# Patient Record
Sex: Male | Born: 1955 | Race: White | Hispanic: No | Marital: Married | Smoking: Former smoker
Health system: Southern US, Community
[De-identification: ages and names within clinical notes are randomized; demographics above are authoritative.]

## PROBLEM LIST (undated history)

## (undated) DIAGNOSIS — G4730 Sleep apnea, unspecified: Secondary | ICD-10-CM

## (undated) DIAGNOSIS — E119 Type 2 diabetes mellitus without complications: Secondary | ICD-10-CM

## (undated) HISTORY — PX: TONSILLECTOMY: SUR1361

## (undated) HISTORY — PX: CARDIAC CATHETERIZATION: SHX172

## (undated) HISTORY — PX: APPENDECTOMY: SHX54

---

## 2004-08-10 ENCOUNTER — Emergency Department: Payer: Self-pay | Admitting: Emergency Medicine

## 2005-04-12 ENCOUNTER — Ambulatory Visit: Payer: Self-pay | Admitting: Unknown Physician Specialty

## 2006-04-30 IMAGING — CT CT HEAD WITHOUT CONTRAST
1 series · 16 of 30 positions shown, 20 images · non-contrast
Comparison: none

REASON FOR EXAM: headache pt in rm 2
COMMENTS:

[Series 2: without · axial · non-contrast · 0.39mm/px · z∈[+158,+293]mm · 16 of 30 slices shown, 20 images]
[im 2/30  brain]
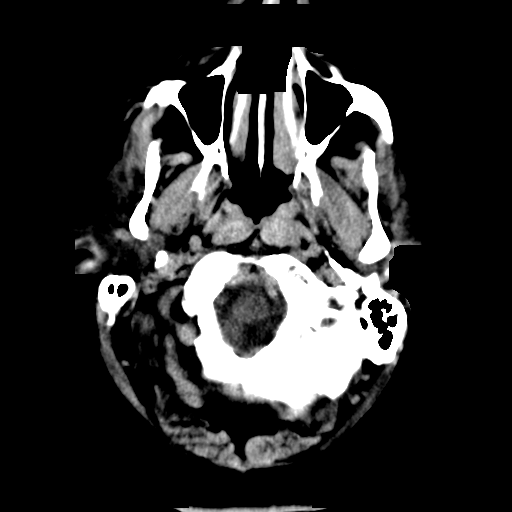
[im 2/30  bone]
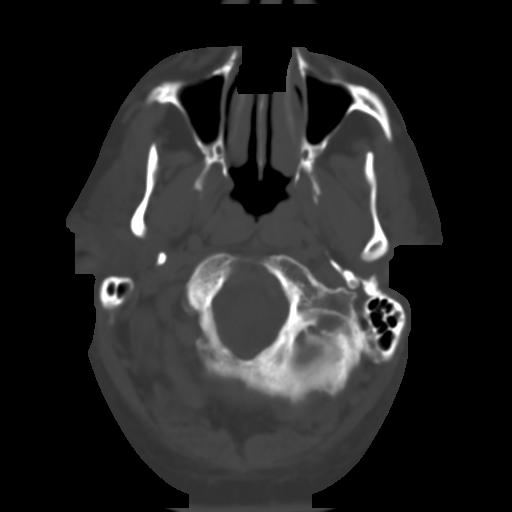
[im 4/30  brain]
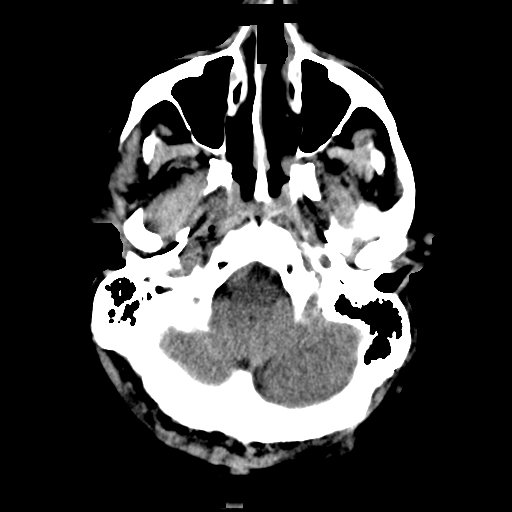
[im 6/30  brain]
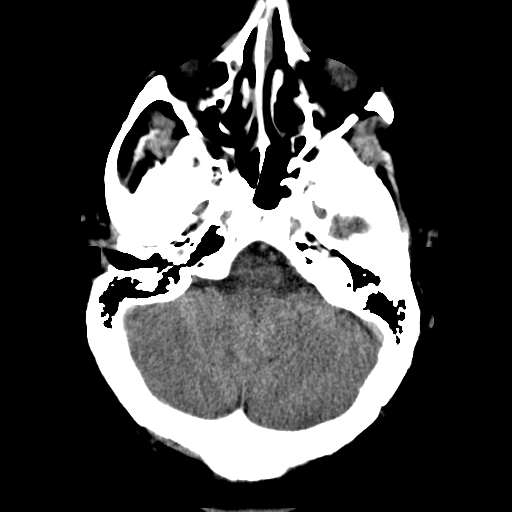
[im 8/30  brain]
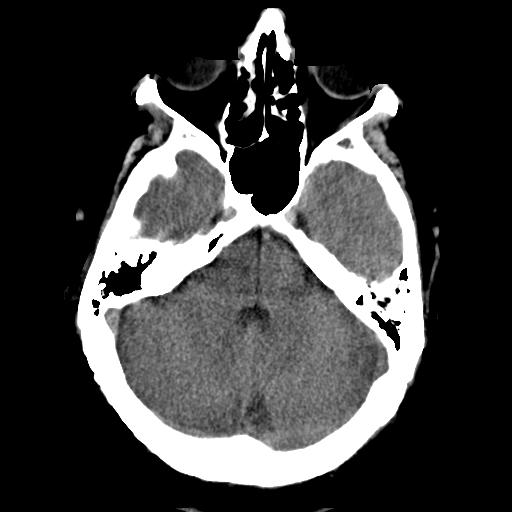
[im 9/30  brain]
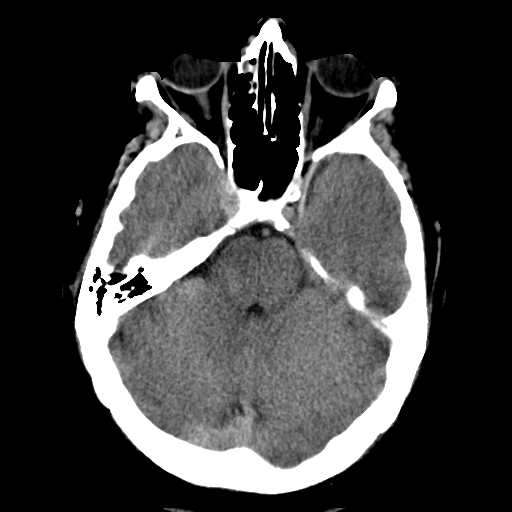
[im 9/30  bone]
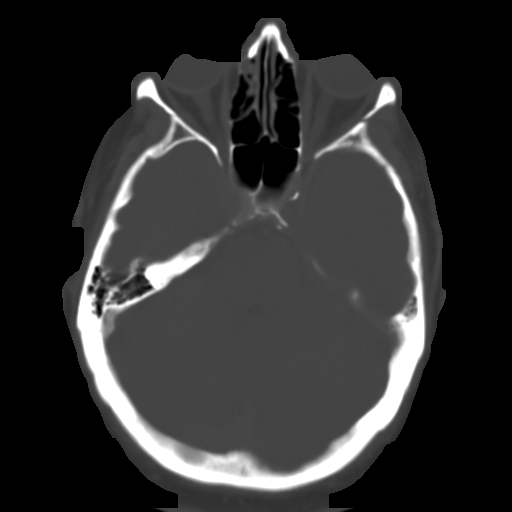
[im 11/30  brain]
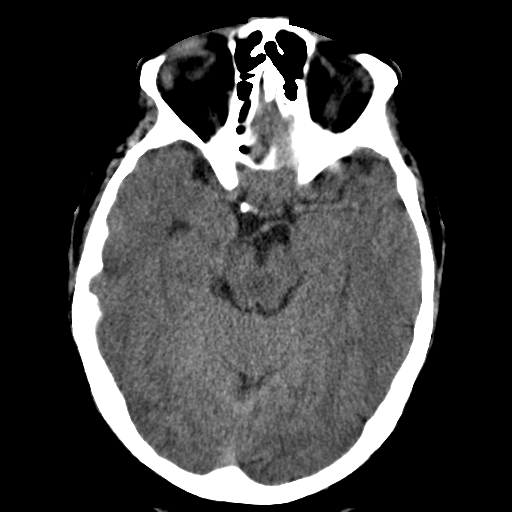
[im 13/30  brain]
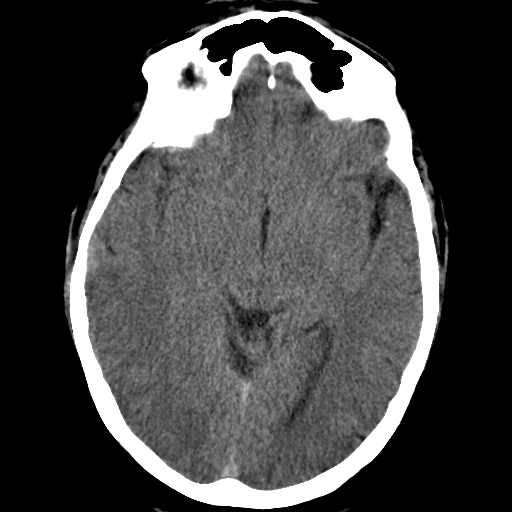
[im 15/30  brain]
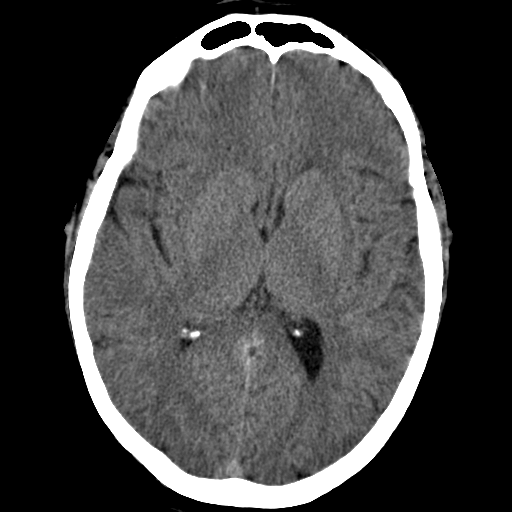
[im 16/30  brain]
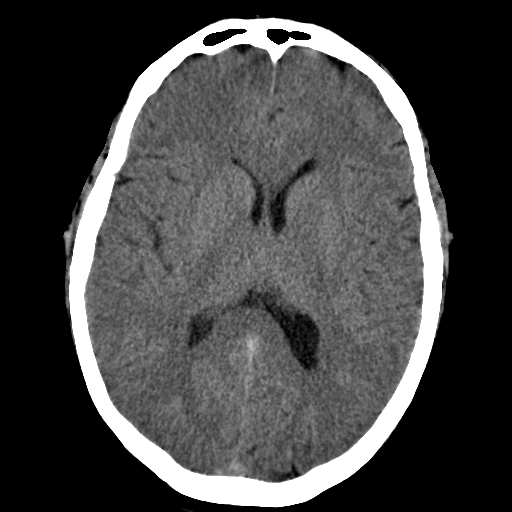
[im 16/30  bone]
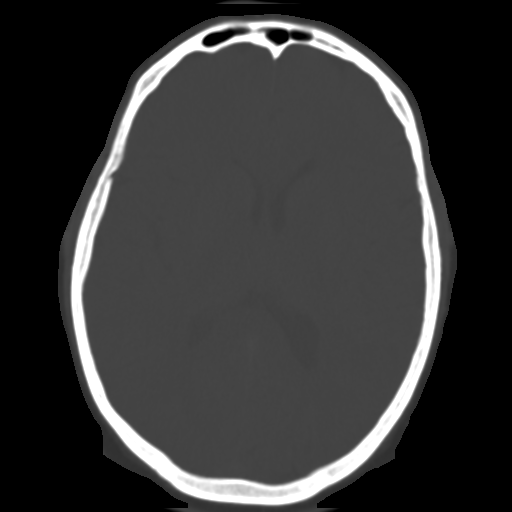
[im 18/30  brain]
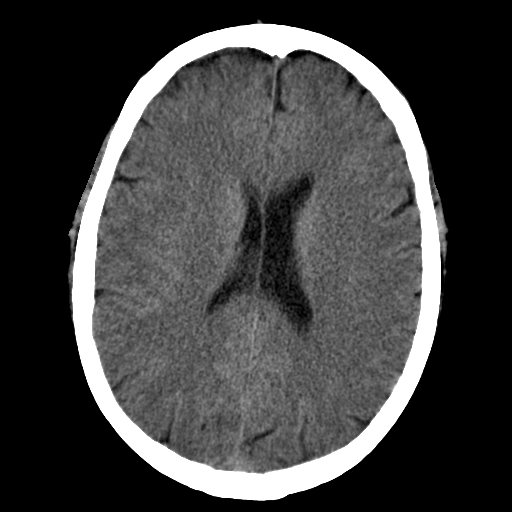
[im 20/30  brain]
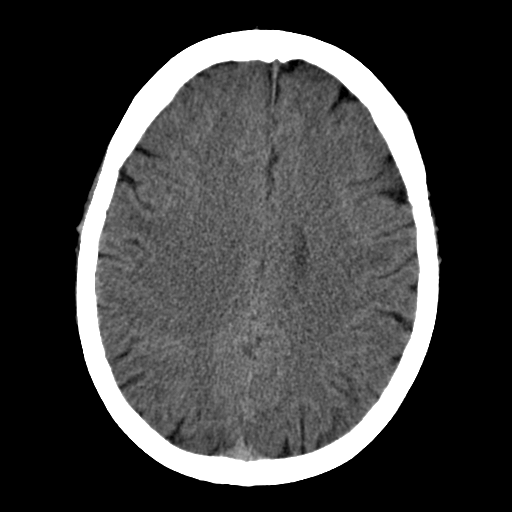
[im 22/30  brain]
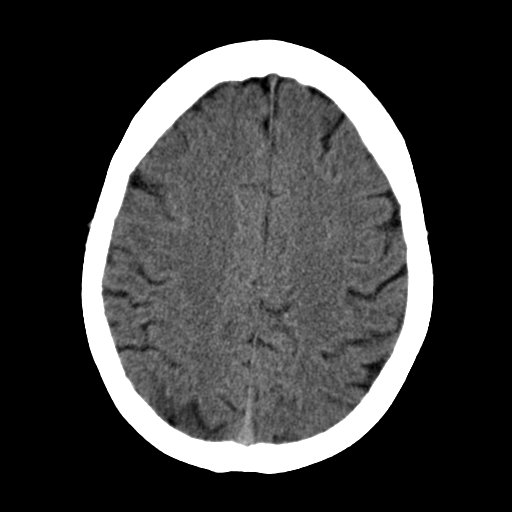
[im 23/30  brain]
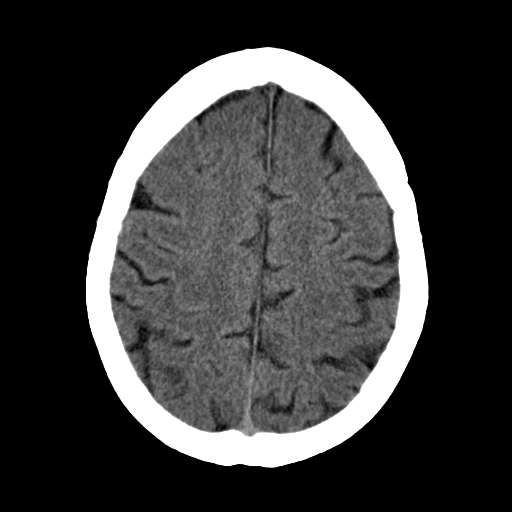
[im 23/30  bone]
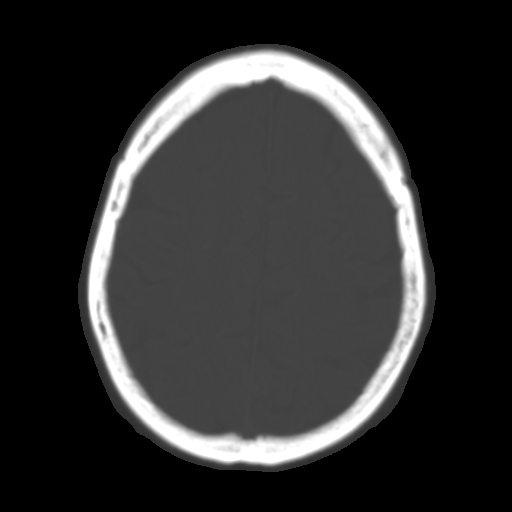
[im 25/30  brain]
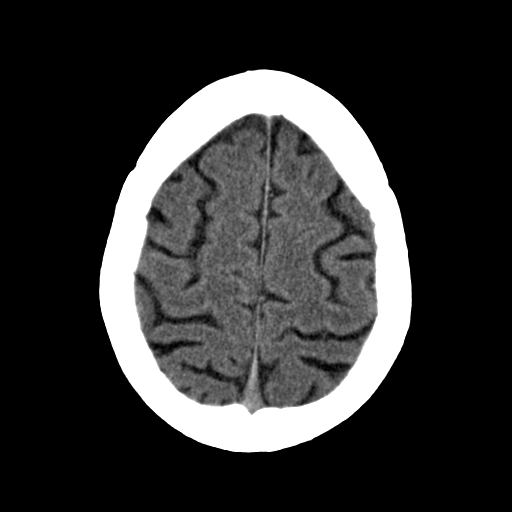
[im 27/30  brain]
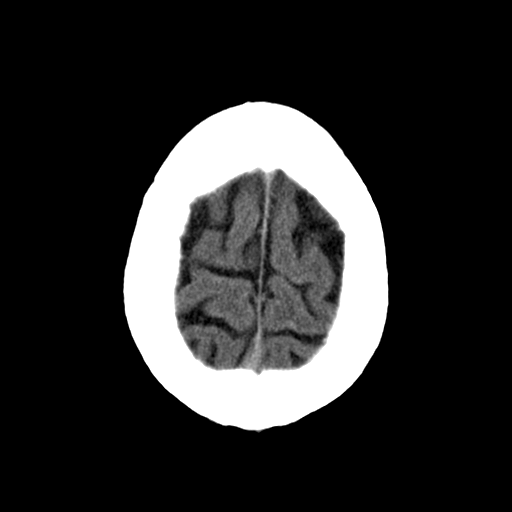
[im 29/30  brain]
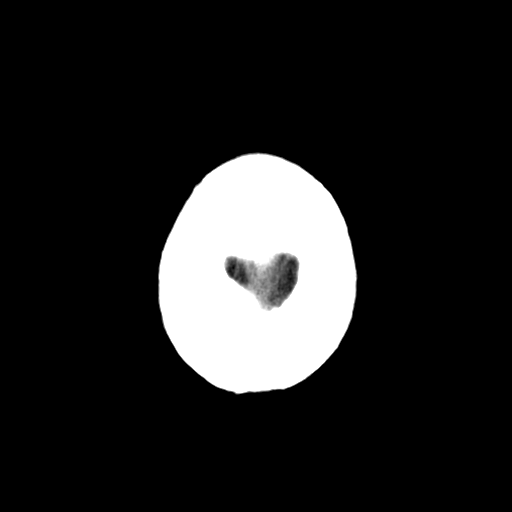

[16 of 30 positions shown; findings below may reference images not displayed]

PROCEDURE:     CT  - CT HEAD WITHOUT CONTRAST  - August 10, 2004  [DATE]

RESULT:     An emergent unenhanced Head CT was performed for headache. No
subarachnoid hemorrhage is identified. No intracerebral bleeds with no mass
effect. No shift to the midline. The ventricles appear within normal limits.
 No subdural hematomas are noted. No infarcts are noted.
IMPRESSION: 1)No acute findings are identified on the unenhanced emergent Head CT.

The report was called to the [HOSPITAL] the conclusion of the
examination.

## 2010-01-28 ENCOUNTER — Observation Stay: Payer: Self-pay | Admitting: Internal Medicine

## 2011-07-19 ENCOUNTER — Ambulatory Visit: Payer: Self-pay | Admitting: Family Medicine

## 2011-10-18 IMAGING — CR DG CHEST 1V PORT
1 series · 1 of 1 positions shown · non-contrast
Comparison: none

REASON FOR EXAM: pain
COMMENTS:

PROCEDURE:     DXR - DXR PORTABLE CHEST SINGLE VIEW  - January 28, 2010  [DATE]
RESULT:     The lung fields are clear. The heart, mediastinal and osseous
structures show no acute changes. Monitoring electrodes are present.

[view not recorded]
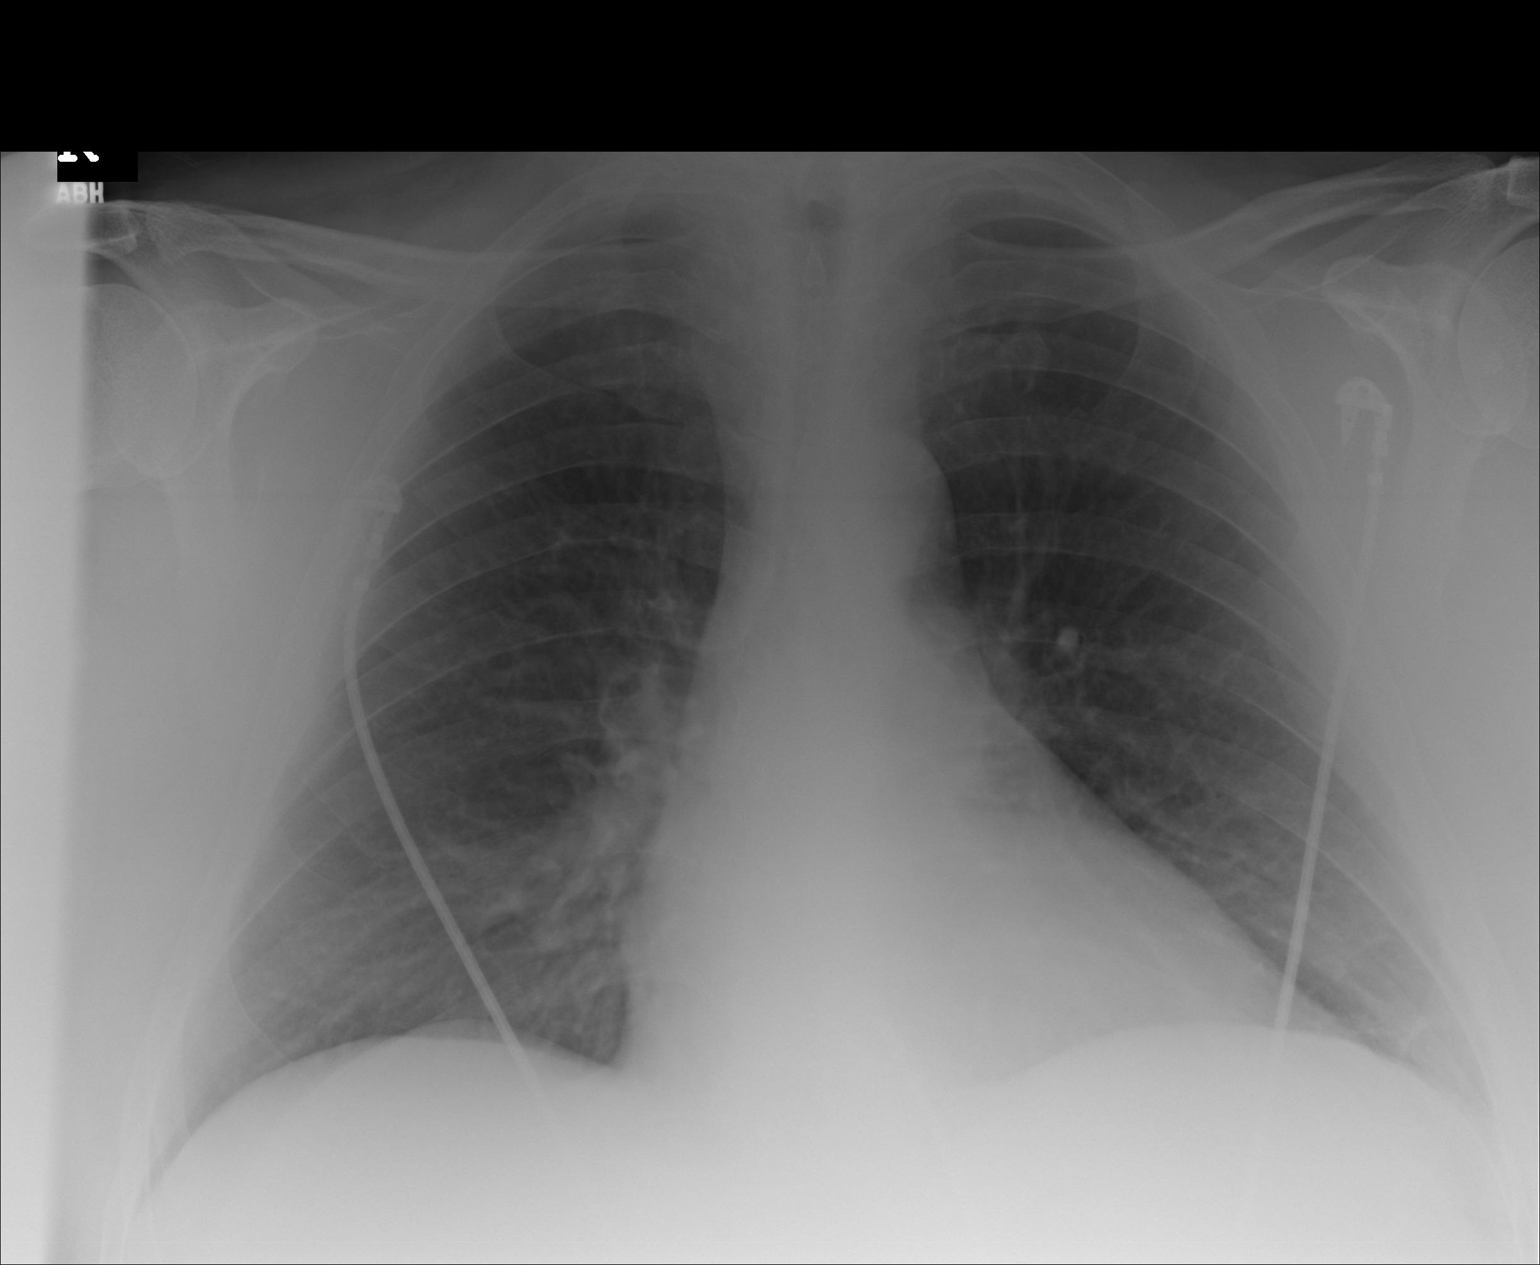

[1 of 1 positions shown; findings below may reference images not displayed]

IMPRESSION: No acute changes are identified.

## 2013-04-07 IMAGING — CT CT STONE STUDY
1 of 2 series · 15 of 32 positions shown, 19 images · non-contrast
Comparison: none

REASON FOR EXAM: hematuria eval for kidney stones
COMMENTS:

PROCEDURE:     KCT - KCT ABDOMEN/PELVIS WO (STONE)  - July 19, 2011  [DATE]
RESULT:
TECHNIQUE: Helical noncontrast 3 mm sections were obtained from the lung
bases through the pubic symphysis.

[Series 2: stonelg 3.0 i40s 3 · axial · 0.98mm/px · z∈[-769,-337]mm · 15 of 157 slices shown, 19 images]
[im 7/157  soft-tissue]
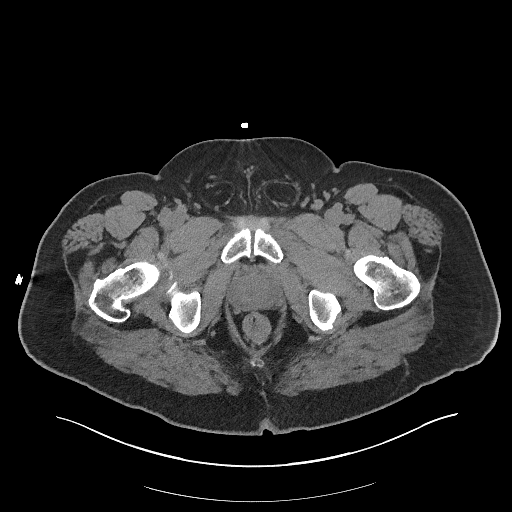
[im 7/157  bone]
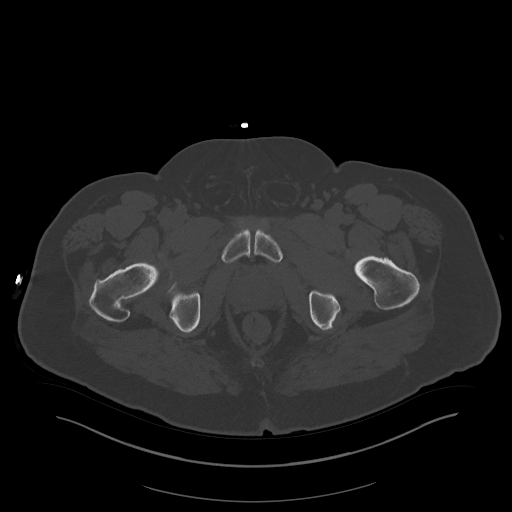
[im 19/157  soft-tissue]
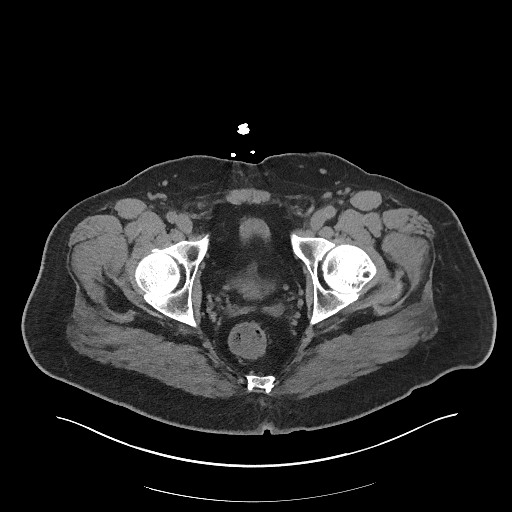
[im 31/157  soft-tissue]
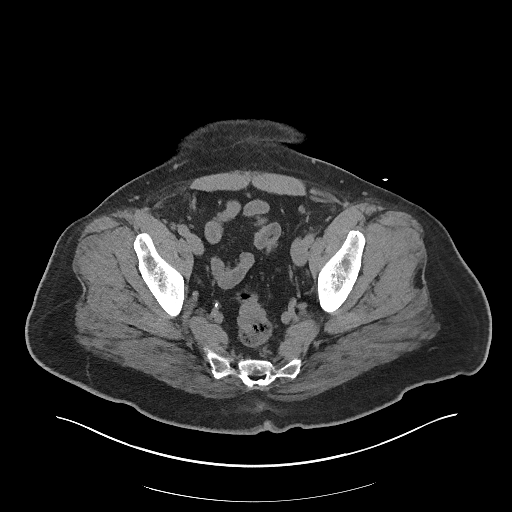
[im 43/157  soft-tissue]
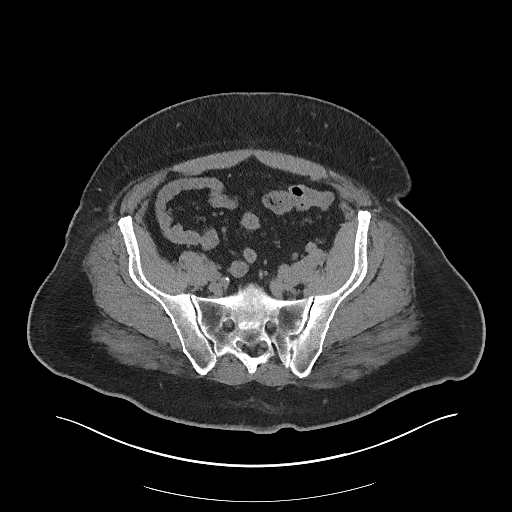
[im 55/157  soft-tissue]
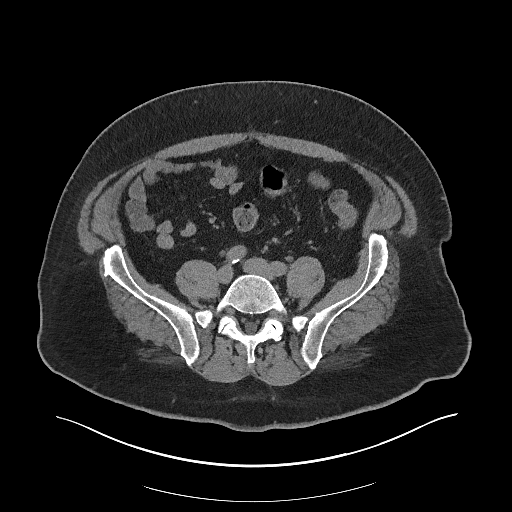
[im 67/157  soft-tissue]
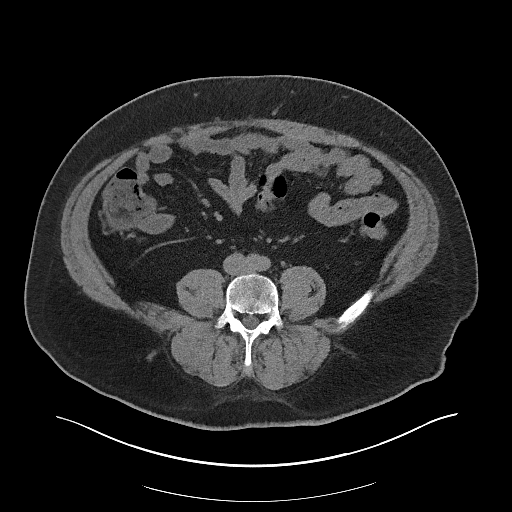
[im 79/157  soft-tissue]
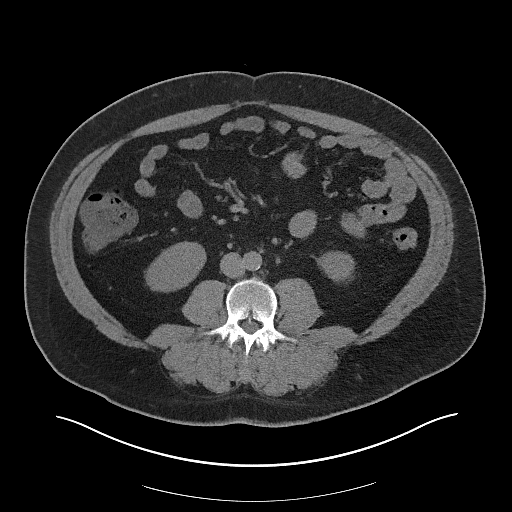
[im 91/157  soft-tissue]
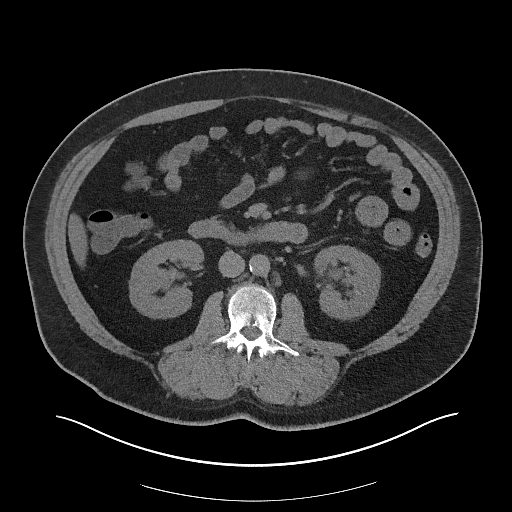
[im 103/157  soft-tissue]
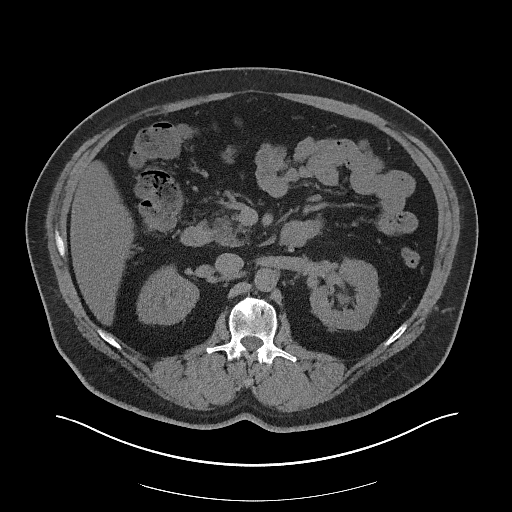
[im 103/157  bone]
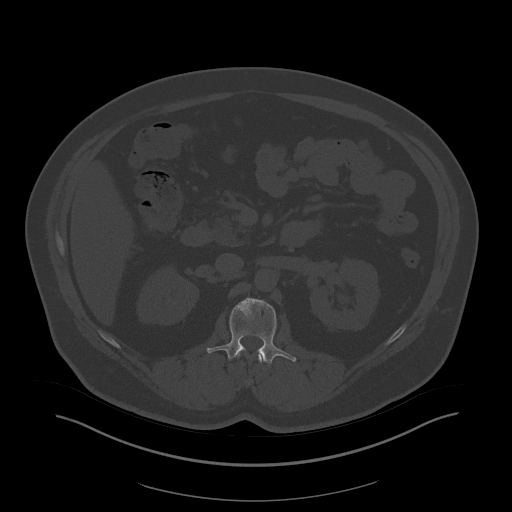
[im 115/157  soft-tissue]
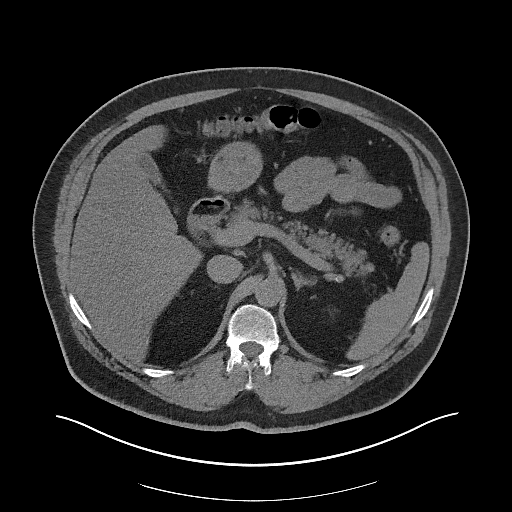
[im 127/157  soft-tissue]
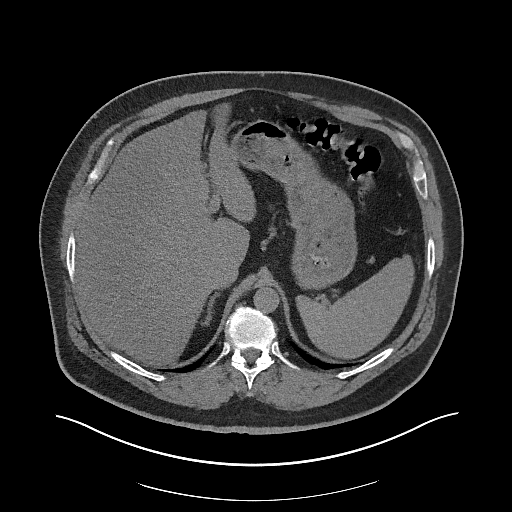
[im 133/157  lung]
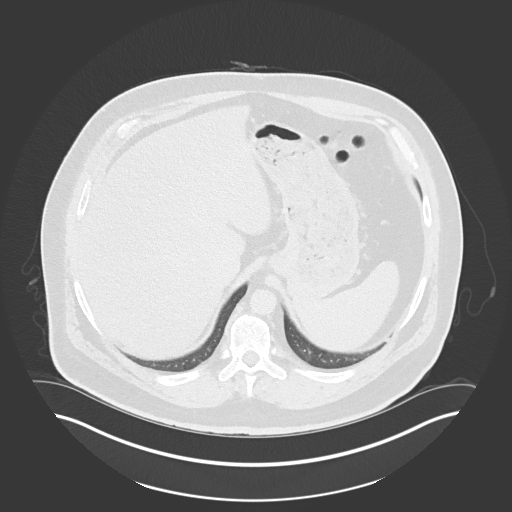
[im 139/157  soft-tissue]
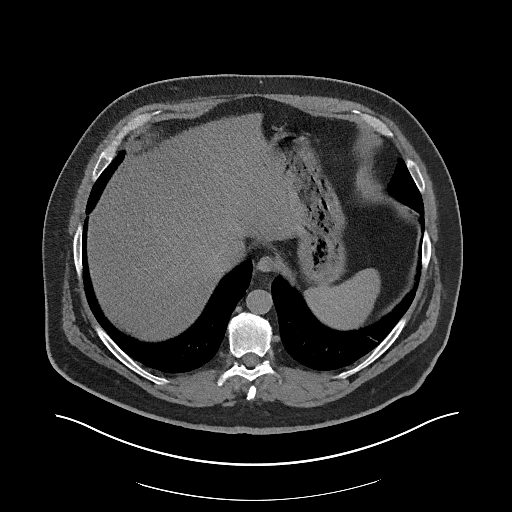
[im 139/157  lung]
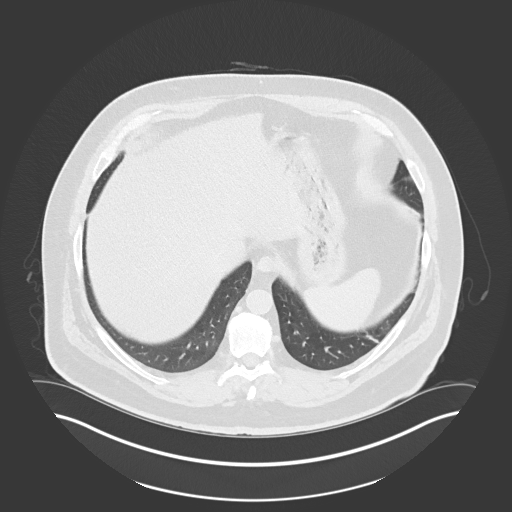
[im 145/157  lung]
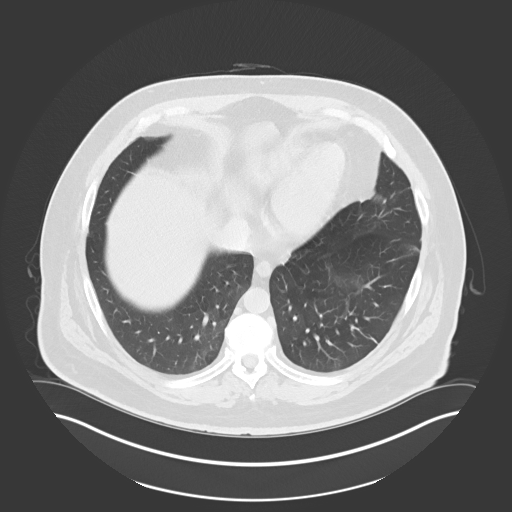
[im 151/157  soft-tissue]
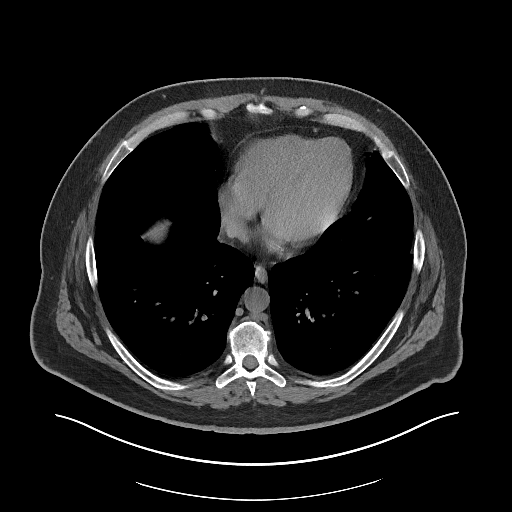
[im 151/157  lung]
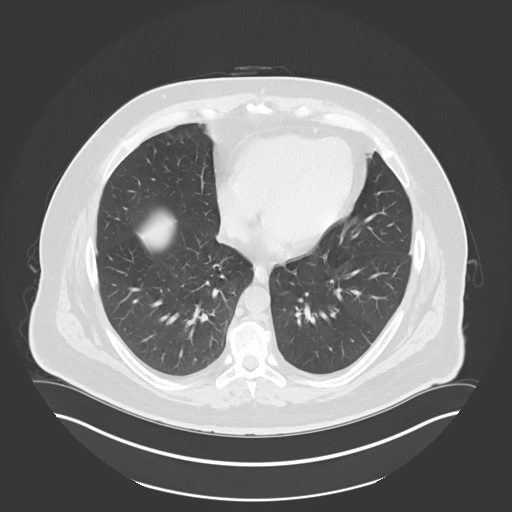

[15 of 32 positions shown; findings below may reference images not displayed]

FINDINGS: The lung bases are unremarkable.

A 2 x 1.13 cm calculus is identified within the renal pelvis on the left.
There is mild hydronephrosis. The renal pelvis is moderately dilated. There
is inflammatory change within the peripelvic fat as well as along the
proximal periureteral fat. Calculi are also identified within the medullary
portion of the lower pole of the left kidney.

Noncontrast evaluation of the liver, spleen, adrenals, pancreas, and right
kidney are unremarkable. There is no evidence of an abdominal aortic
aneurysm. There is no CT evidence of bowel obstruction or secondary signs
reflecting enteritis, colitis, diverticulitis or appendicitis.
IMPRESSION: 1.     Medullary calculus on the left with associated mild obstructive
uropathy. The renal pelvis is dilated and demonstrates irregularity within
the peripelvic fat. Inflammatory change within the fat cannot be excluded.
Note, the renal pelvis is also ill-defined and a mass within the pelvis
cannot be excluded. Etiologies such as hemorrhage or nonhemorrhagic
proteinaceous material are also of diagnostic consideration. Urologic
consultation is recommended. Repeat evaluation status post appropriate
therapeutic regiment is recommended to ensure resolution of the pelvic
findings.

## 2014-07-18 ENCOUNTER — Ambulatory Visit: Payer: Self-pay | Admitting: Unknown Physician Specialty

## 2014-11-28 LAB — SURGICAL PATHOLOGY

## 2017-08-27 ENCOUNTER — Encounter: Payer: Self-pay | Admitting: Dietician

## 2017-08-27 ENCOUNTER — Encounter: Payer: 59 | Attending: Family Medicine | Admitting: Dietician

## 2017-08-27 VITALS — Ht 69.0 in | Wt 265.6 lb

## 2017-08-27 DIAGNOSIS — Z713 Dietary counseling and surveillance: Secondary | ICD-10-CM | POA: Diagnosis not present

## 2017-08-27 DIAGNOSIS — E669 Obesity, unspecified: Secondary | ICD-10-CM | POA: Diagnosis not present

## 2017-08-27 DIAGNOSIS — E119 Type 2 diabetes mellitus without complications: Secondary | ICD-10-CM

## 2017-08-27 DIAGNOSIS — E1165 Type 2 diabetes mellitus with hyperglycemia: Secondary | ICD-10-CM | POA: Diagnosis not present

## 2017-08-27 DIAGNOSIS — Z6839 Body mass index (BMI) 39.0-39.9, adult: Secondary | ICD-10-CM

## 2017-08-27 NOTE — Patient Instructions (Signed)
   Keep working to keep portions of starches to 1 cup or less (smaller than fist-size). Include generous portions of the "free" vegetables.   Keep portions of meats to palm-size or less (4oz)  Choose fruit for some snacks along with a small portion of peanut butter or low fat cheese or 1/4 cup nuts. Can also try a fruit at the end of a meal for healthy carb and nutrition.

## 2017-08-27 NOTE — Progress Notes (Signed)
Medical Nutrition Therapy: Visit start time: 1615  end time: 1715  Assessment:  Diagnosis: diabetes, obesity Past medical history: sleep apnea, HTN, history of kidney stones Psychosocial issues/ stress concerns: none Preferred learning method:  . Hands-on  Current weight: 265.6lbs with shoes  Height: 5'9" Medications, supplements: reconciled list in medical record  Progress and evaluation: Patient reports working ot eat smaller poritons in effort to lose weight. Has maybe lost about 2lbs in recent weeks. 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 He is unable to exercise outside of work activity due to long work shifts and long commute. He is not testing BGs at home regularly, cannot bring himself to do a finger prick; wife occasionally checks his blood sugar.   Physical activity: on the job activity -- lifting, moving, walking  Dietary Intake:  Usual eating pattern includes 3 meals and 2-3 snacks per day. Dining out frequency: 6 meals per week.  Breakfast: 1 biscuit ([employer]) with sausage, bacon, steak, or chicken --and egg; black coffee or water Snack: sometimes biscuit from home or peanut butter crackers Lunch: sandwich, small bag chips, sugar free jello or pudding or fruit and water or black coffee Snack: sometimes cashews or pkg nabs  Supper: 1/22 country style steak, biscuit gravy, peas. 1/21 potato soup, cornbread, some nights tossed salad only.  Snack: occasionally 3 ginger snaps Beverages: water, black coffee (no tea due to history of kidney stones)  Nutrition Care Education: Topics covered: diabetes, weight control Basic nutrition: basic food groups, appropriate nutrient balance, appropriate meal and snack schedule, general nutrition guidelines    Weight control: benefits of weight control, importance of low fat and low sugar choices, strategies for portion control, role of physical  activity/ exercise Diabetes: appropriate meal and snack schedule, appropriate carb intake and balance, healthy carb choices, importance of increasing vegetable and fruit intake while controlling amounts of starch and proteins; basic meal planning using plate method and food models.    Nutritional Diagnosis:  [place]-2.2 Altered nutrition-related laboratory As related to Type 2 diabetes.  As evidenced by HbA1C of 8%. [place]-3.3 Overweight/obesity As related to excess calories.  As evidenced by BMI 39.2, patient report.  Intervention: Instruction as noted above.   Significant change will be a challenge for patient with his work schedule. He agrees to continue working on portion control.    Set goals with direction from patient.   They declined scheduling follow-up at this time, but will schedule later if needed.   Education Materials given:  . General diet guidelines for Diabetes . Plate Planner with food lists . Food lists/ Planning A Balanced Meal . Sample menus . Goals/ instructions  Learner/ who was taught:  . Patient  . Spouse/ partner  Level of understanding: Marland Kitchen. Verbalizes/ demonstrates competency  Demonstrated degree of understanding via:   Teach back Learning barriers: . None  Willingness to learn/ readiness for change: . Eager, some change in progress -- confidence to make further changes is limited  Monitoring and Evaluation:  Dietary intake, exercise, BG control, and body weight      follow up: prn

## 2020-09-22 ENCOUNTER — Other Ambulatory Visit: Payer: Self-pay

## 2020-09-22 ENCOUNTER — Other Ambulatory Visit
Admission: RE | Admit: 2020-09-22 | Discharge: 2020-09-22 | Disposition: A | Discharge status: Home / Self Care | Payer: Medicare Other | Source: Ambulatory Visit | Attending: Gastroenterology | Admitting: Gastroenterology

## 2020-09-22 DIAGNOSIS — Z01812 Encounter for preprocedural laboratory examination: Secondary | ICD-10-CM | POA: Insufficient documentation

## 2020-09-22 DIAGNOSIS — Z20822 Contact with and (suspected) exposure to covid-19: Secondary | ICD-10-CM | POA: Diagnosis not present

## 2020-09-22 LAB — SARS CORONAVIRUS 2 (TAT 6-24 HRS): SARS Coronavirus 2: NEGATIVE

## 2020-09-26 ENCOUNTER — Ambulatory Visit
Admission: RE | Admit: 2020-09-26 | Discharge: 2020-09-26 | Disposition: A | Discharge status: Home / Self Care | Payer: Medicare Other | Attending: Gastroenterology | Admitting: Gastroenterology

## 2020-09-26 ENCOUNTER — Encounter: Payer: Self-pay | Admitting: *Deleted

## 2020-09-26 ENCOUNTER — Encounter
Admission: RE | Disposition: A | Discharge status: Home / Self Care | Payer: Self-pay | Source: Home / Self Care | Attending: Gastroenterology

## 2020-09-26 ENCOUNTER — Ambulatory Visit: Payer: Medicare Other | Admitting: Certified Registered Nurse Anesthetist

## 2020-09-26 ENCOUNTER — Other Ambulatory Visit: Payer: Self-pay

## 2020-09-26 DIAGNOSIS — K640 First degree hemorrhoids: Secondary | ICD-10-CM | POA: Insufficient documentation

## 2020-09-26 DIAGNOSIS — Z79899 Other long term (current) drug therapy: Secondary | ICD-10-CM | POA: Diagnosis not present

## 2020-09-26 DIAGNOSIS — Z86010 Personal history of colonic polyps: Secondary | ICD-10-CM | POA: Insufficient documentation

## 2020-09-26 DIAGNOSIS — Z7984 Long term (current) use of oral hypoglycemic drugs: Secondary | ICD-10-CM | POA: Diagnosis not present

## 2020-09-26 DIAGNOSIS — K5730 Diverticulosis of large intestine without perforation or abscess without bleeding: Secondary | ICD-10-CM | POA: Insufficient documentation

## 2020-09-26 DIAGNOSIS — Z1211 Encounter for screening for malignant neoplasm of colon: Secondary | ICD-10-CM | POA: Insufficient documentation

## 2020-09-26 DIAGNOSIS — E119 Type 2 diabetes mellitus without complications: Secondary | ICD-10-CM | POA: Insufficient documentation

## 2020-09-26 HISTORY — DX: Type 2 diabetes mellitus without complications: E11.9

## 2020-09-26 HISTORY — DX: Sleep apnea, unspecified: G47.30

## 2020-09-26 HISTORY — PX: COLONOSCOPY: SHX5424

## 2020-09-26 SURGERY — COLONOSCOPY
Anesthesia: General

## 2020-09-26 MED ORDER — PROPOFOL 500 MG/50ML IV EMUL
INTRAVENOUS | Status: DC | PRN
  Administered 2020-09-26: 140 ug/kg/min via INTRAVENOUS

## 2020-09-26 MED ORDER — SODIUM CHLORIDE 0.9 % IV SOLN: INTRAVENOUS | Status: DC

## 2020-09-26 MED ORDER — PROPOFOL 10 MG/ML IV BOLUS
INTRAVENOUS | Status: AC
  Filled 2020-09-26: qty 20

## 2020-09-26 MED ORDER — LIDOCAINE HCL (CARDIAC) PF 100 MG/5ML IV SOSY
PREFILLED_SYRINGE | INTRAVENOUS | Status: DC | PRN
  Administered 2020-09-26: 100 mg via INTRAVENOUS

## 2020-09-26 MED ORDER — PROPOFOL 10 MG/ML IV BOLUS
INTRAVENOUS | Status: DC | PRN
  Administered 2020-09-26: 10 mg via INTRAVENOUS
  Administered 2020-09-26: 40 mg via INTRAVENOUS
  Administered 2020-09-26: 20 mg via INTRAVENOUS

## 2020-09-26 NOTE — H&P (Signed)
Outpatient short stay form Pre-procedure 09/26/2020 10:58 AM Merlyn Lot MD, MPH  Primary Physician: [facility]  Reason for visit:  Surveillance/Diarrhea  History of present illness:   65 y/o gentleman with history of polyps here for colonoscopy. No blood thinners. No family history of GI malignancies. No abdominal surgeries. Occasional loose stool.    Current Facility-Administered Medications:  .  0.9 %  sodium chloride infusion, , Intravenous, Continuous, Locklear, Rossie Muskrat, MD, Last Rate: 20 mL/hr at 09/26/20 1040, New Bag at 09/26/20 1040  Medications Prior to Admission  Medication Sig Dispense Refill Last Dose  . acetaminophen (TYLENOL) 325 MG tablet Take by mouth.   09/25/2020 at 2100  . chlorthalidone (HYGROTON) 25 MG tablet    09/25/2020 at 0800  . diphenhydrAMINE (BENADRYL) 25 mg capsule Take by mouth.   09/25/2020 at 2100  . metFORMIN (GLUCOPHAGE) 1000 MG tablet Take by mouth.   09/25/2020 at 0600  . omeprazole (PRILOSEC) 20 MG capsule Take by mouth.   09/25/2020 at Unknown time  . pioglitazone (ACTOS) 15 MG tablet Take by mouth.   09/25/2020 at Unknown time  . potassium citrate (UROCIT-K) 10 MEQ (1080 MG) SR tablet Take by mouth.   09/25/2020 at Unknown time  . rosuvastatin (CRESTOR) 5 MG tablet Take 5 mg by mouth daily.   09/25/2020 at Unknown time  . tamsulosin (FLOMAX) 0.4 MG CAPS capsule Take 0.4 mg by mouth.   09/25/2020 at Unknown time  . testosterone cypionate (DEPOTESTOSTERONE CYPIONATE) 200 MG/ML injection    09/25/2020 at Unknown time  . lisinopril (PRINIVIL,ZESTRIL) 2.5 MG tablet Take by mouth. (Patient not taking: Reported on 09/25/2020)   Not Taking at Unknown time  . SYRINGE-NEEDLE, DISP, 3 ML (SAFETY-LOK 3CC SYR 22GX1.5") 22G X 1-1/2" 3 ML MISC Use 1 Units monthly.        No Known Allergies   Past Medical History:  Diagnosis Date  . Diabetes mellitus without complication (HCC)   . Sleep apnea     Review of systems:  Otherwise negative.    Physical  Exam  Gen: Alert, oriented. Appears stated age.  HEENT: PERRLA. Lungs: No respiratory distress CV: RRR Abd: soft, benign, no masses Ext: No edema    Planned procedures: Proceed with colonoscopy. The patient understands the nature of the planned procedure, indications, risks, alternatives and potential complications including but not limited to bleeding, infection, perforation, damage to internal organs and possible oversedation/side effects from anesthesia. The patient agrees and gives consent to proceed.  Please refer to procedure notes for findings, recommendations and patient disposition/instructions.     Merlyn Lot MD, MPH Gastroenterology 09/26/2020  10:58 AM

## 2020-09-26 NOTE — Op Note (Signed)
[facility] Gastroenterology Patient Name: Gregory Carroll Procedure Date: 09/26/2020 10:59 AM MRN: [id number] Account #: [id number] Date of Birth: January 15, 1956 Admit Type: Outpatient Age: 65 Room: [facility] ENDO ROOM 1 Gender: Male Note Status: Finalized Procedure:             Colonoscopy Indications:           High risk colon cancer surveillance: Personal history                         of colonic polyps Providers:             Andrey Farmer MD, MD Referring MD:          No Local Md, MD (Referring MD) Medicines:             Monitored Anesthesia Care Complications:         No immediate complications. Estimated blood loss:                         Minimal. Procedure:             Pre-Anesthesia Assessment:                        - Prior to the procedure, a History and Physical was                         performed, and patient medications and allergies were                         reviewed. The patient is competent. The risks and                         benefits of the procedure and the sedation options and                         risks were discussed with the patient. All questions                         were answered and informed consent was obtained.                         Patient identification and proposed procedure were                         verified by the physician, the nurse, the anesthetist                         and the technician in the endoscopy suite. Mental                         Status Examination: alert and oriented. Airway                         Examination: normal oropharyngeal airway and neck                         mobility. Respiratory Examination: clear to  auscultation. CV Examination: normal. Prophylactic                         Antibiotics: The patient does not require prophylactic                         antibiotics. Prior Anticoagulants: The patient has                         taken no previous anticoagulant or  antiplatelet                         agents. ASA Grade Assessment: II - A patient with mild                         systemic disease. After reviewing the risks and                         benefits, the patient was deemed in satisfactory                         condition to undergo the procedure. The anesthesia                         plan was to use monitored anesthesia care (MAC).                         Immediately prior to administration of medications,                         the patient was re-assessed for adequacy to receive                         sedatives. The heart rate, respiratory rate, oxygen                         saturations, blood pressure, adequacy of pulmonary                         ventilation, and response to care were monitored                         throughout the procedure. The physical status of the                         patient was re-assessed after the procedure.                        After obtaining informed consent, the colonoscope was                         passed under direct vision. Throughout the procedure,                         the patient's blood pressure, pulse, and oxygen                         saturations were monitored continuously. The  Colonoscope was introduced through the anus and                         advanced to the the cecum, identified by appendiceal                         orifice and ileocecal valve. The colonoscopy was                         performed without difficulty. The patient tolerated                         the procedure well. The quality of the bowel                         preparation was adequate to identify polyps. Findings:      The perianal and digital rectal examinations were normal.      Normal mucosa was found in the entire colon. Biopsies for histology were       taken with a cold forceps from the entire colon for evaluation of       microscopic colitis. Estimated blood loss was  minimal.      A few small-mouthed diverticula were found in the sigmoid colon.      Internal hemorrhoids were found during retroflexion. The hemorrhoids       were Grade I (internal hemorrhoids that do not prolapse).      The exam was otherwise without abnormality on direct and retroflexion       views. Impression:            - Normal mucosa in the entire examined colon. Biopsied.                        - Diverticulosis in the sigmoid colon.                        - Internal hemorrhoids.                        - The examination was otherwise normal on direct and                         retroflexion views. Recommendation:        - Discharge patient to home.                        - Resume previous diet.                        - Continue present medications.                        - Await pathology results.                        - Repeat colonoscopy in 10 years for surveillance.                        - Return to referring physician as previously  scheduled. Procedure Code(s):     --- Professional ---                        [phone], Colonoscopy, flexible; with biopsy, single or                         multiple Diagnosis Code(s):     --- Professional ---                        Z86.010, Personal history of colonic polyps                        K64.0, First degree hemorrhoids                        K57.30, Diverticulosis of large intestine without                         perforation or abscess without bleeding CPT copyright 2019 [employer] Association. All rights reserved. The codes documented in this report are preliminary and upon coder review may  be revised to meet current compliance requirements. Andrey Farmer MD, MD 09/26/2020 11:29:29 AM Number of Addenda: 0 Note Initiated On: 09/26/2020 10:59 AM Scope Withdrawal Time: 0 hours 11 minutes 13 seconds  Total Procedure Duration: 0 hours 16 minutes 30 seconds  Estimated Blood Loss:  Estimated blood loss  was minimal.      [facility]

## 2020-09-26 NOTE — Transfer of Care (Addendum)
Immediate Anesthesia Transfer of Care Note  Patient: Gregory Carroll  Procedure(s) Performed: COLONOSCOPY (N/A )  Patient Location: Endoscopy Unit  Anesthesia Type:General  Level of Consciousness: drowsy  Airway & Oxygen Therapy: Patient Spontanous Breathing  Post-op Assessment: Report given to RN and Post -op Vital signs reviewed and stable  Post vital signs: Reviewed and stable  Last Vitals:  Vitals Value Taken Time  BP 112/68   Temp    Pulse 77 09/26/20 1128  Resp 12 09/26/20 1128  SpO2 95 % 09/26/20 1128  Vitals shown include unvalidated device data.  Last Pain:  Vitals:   09/26/20 1027  TempSrc: Temporal  PainSc: 0-No pain      Patients Stated Pain Goal: 0 (09/26/20 1027)  Complications: No complications documented.

## 2020-09-26 NOTE — Anesthesia Procedure Notes (Signed)
Procedure Name: MAC Date/Time: 09/26/2020 11:08 AM Performed by: Lily Peer, Lamona Eimer, CRNA Pre-anesthesia Checklist: Patient identified, Emergency Drugs available, Suction available, Patient being monitored and Timeout performed Patient Re-evaluated:Patient Re-evaluated prior to induction Oxygen Delivery Method: Nasal cannula Induction Type: IV induction

## 2020-09-26 NOTE — Interval H&P Note (Signed)
History and Physical Interval Note:  09/26/2020 11:01 AM  Gregory Carroll  has presented today for surgery, with the diagnosis of PERSONAL HX.OF COLON POLYPS.  The various methods of treatment have been discussed with the patient and family. After consideration of risks, benefits and other options for treatment, the patient has consented to  Procedure(s): COLONOSCOPY (N/A) as a surgical intervention.  The patient's history has been reviewed, patient examined, no change in status, stable for surgery.  I have reviewed the patient's chart and labs.  Questions were answered to the patient's satisfaction.     Regis Bill  Ok to proceed with colonoscopy

## 2020-09-26 NOTE — Interval H&P Note (Signed)
History and Physical Interval Note:  09/26/2020 11:00 AM  Gregory Carroll  has presented today for surgery, with the diagnosis of PERSONAL HX.OF COLON POLYPS.  The various methods of treatment have been discussed with the patient and family. After consideration of risks, benefits and other options for treatment, the patient has consented to  Procedure(s): COLONOSCOPY (N/A) as a surgical intervention.  The patient's history has been reviewed, patient examined, no change in status, stable for surgery.  I have reviewed the patient's chart and labs.  Questions were answered to the patient's satisfaction.     Regis Bill  Ok to proceed with EGD

## 2020-09-26 NOTE — Anesthesia Preprocedure Evaluation (Signed)
Anesthesia Evaluation  Patient identified by MRN, date of birth, ID band Patient awake    Reviewed: Allergy & Precautions, H&P , NPO status , Patient's Chart, lab work & pertinent test results  Airway Mallampati: II  TM Distance: >3 FB Neck ROM: Full    Dental  (+) Lower Dentures, Upper Dentures   Pulmonary sleep apnea and Continuous Positive Airway Pressure Ventilation , former smoker,    Pulmonary exam normal        Cardiovascular Exercise Tolerance: Good negative cardio ROS Normal cardiovascular exam     Neuro/Psych negative neurological ROS  negative psych ROS   GI/Hepatic negative GI ROS, Neg liver ROS, Bowel prep,  Endo/Other  negative endocrine ROSdiabetes, Well Controlled  Renal/GU negative Renal ROS  negative genitourinary   Musculoskeletal negative musculoskeletal ROS (+)   Abdominal   Peds negative pediatric ROS (+)  Hematology negative hematology ROS (+)   Anesthesia Other Findings Type 2 diabetes mellitus with hyperglycemia, without long-term current use of insulin (CMS-HCC)  . Erectile dysfunction  . H/O adenomatous polyp of colon  . Hypogonadism male  . Kidney stone on left side  . Gastroesophageal reflux disease without esophagitis  . Obesity (BMI 35.0-39.9 without comorbidity), unspecified  . Meningioma (CMS-HCC)  . Essential hypertension  . OSA on CPAP  . PVC's (premature ventricular contractions)  . Intracranial meningioma (CMS-HCC)     Reproductive/Obstetrics negative OB ROS                             Anesthesia Physical Anesthesia Plan  ASA: III  Anesthesia Plan: General   Post-op Pain Management:    Induction: Intravenous  PONV Risk Score and Plan: 2 and Propofol infusion and TIVA  Airway Management Planned:   Additional Equipment:   Intra-op Plan:   Post-operative Plan:   Informed Consent: I have reviewed the patients History and Physical,  chart, labs and discussed the procedure including the risks, benefits and alternatives for the proposed anesthesia with the patient or authorized representative who has indicated his/her understanding and acceptance.       Plan Discussed with: CRNA, Anesthesiologist and Surgeon  Anesthesia Plan Comments:         Anesthesia Quick Evaluation

## 2020-09-26 NOTE — Anesthesia Postprocedure Evaluation (Signed)
Anesthesia Post Note  Patient: Gregory Carroll  Procedure(s) Performed: COLONOSCOPY (N/A )  Patient location during evaluation: Phase II Anesthesia Type: General Level of consciousness: awake and alert, awake and oriented Pain management: pain level controlled Vital Signs Assessment: post-procedure vital signs reviewed and stable Respiratory status: spontaneous breathing, nonlabored ventilation and respiratory function stable Cardiovascular status: blood pressure returned to baseline and stable Postop Assessment: no apparent nausea or vomiting Anesthetic complications: no   No complications documented.   Last Vitals:  Vitals:   09/26/20 1147 09/26/20 1157  BP: 123/69 124/74  Pulse: 73 71  Resp: 18 15  Temp:    SpO2: 97% 99%    Last Pain:  Vitals:   09/26/20 1157  TempSrc:   PainSc: 0-No pain                 Manfred Arch

## 2020-09-27 ENCOUNTER — Encounter: Payer: Self-pay | Admitting: Gastroenterology

## 2020-09-27 LAB — SURGICAL PATHOLOGY

## 2022-05-06 ENCOUNTER — Other Ambulatory Visit: Payer: Self-pay | Admitting: Family Medicine

## 2022-05-06 DIAGNOSIS — M542 Cervicalgia: Secondary | ICD-10-CM

## 2022-05-16 ENCOUNTER — Ambulatory Visit
Admission: RE | Admit: 2022-05-16 | Discharge: 2022-05-16 | Disposition: A | Discharge status: Home / Self Care | Payer: Medicare Other | Source: Ambulatory Visit | Attending: Family Medicine | Admitting: Family Medicine

## 2022-05-16 DIAGNOSIS — M542 Cervicalgia: Secondary | ICD-10-CM | POA: Diagnosis present
